# Patient Record
Sex: Male | Born: 1985 | Race: White | Hispanic: No | Marital: Single | Smoking: Current every day smoker
Health system: Southern US, Community
[De-identification: ages and names within clinical notes are randomized; demographics above are authoritative.]

## PROBLEM LIST (undated history)

## (undated) DIAGNOSIS — I10 Essential (primary) hypertension: Secondary | ICD-10-CM

---

## 2007-10-21 ENCOUNTER — Emergency Department: Payer: Self-pay | Admitting: Emergency Medicine

## 2008-10-04 ENCOUNTER — Emergency Department: Payer: Self-pay | Admitting: Emergency Medicine

## 2010-06-23 ENCOUNTER — Emergency Department: Payer: Self-pay | Admitting: Emergency Medicine

## 2011-04-30 ENCOUNTER — Emergency Department: Payer: Self-pay | Admitting: Emergency Medicine

## 2012-10-30 ENCOUNTER — Emergency Department: Payer: Self-pay | Admitting: Emergency Medicine

## 2013-01-08 ENCOUNTER — Emergency Department: Payer: Self-pay | Admitting: Emergency Medicine

## 2013-01-08 LAB — URINALYSIS, COMPLETE
Bacteria: NONE SEEN
Bilirubin,UR: NEGATIVE
Blood: NEGATIVE
Glucose,UR: NEGATIVE mg/dL (ref 0–75)
Nitrite: NEGATIVE
Ph: 7 (ref 4.5–8.0)
Protein: NEGATIVE
RBC,UR: NONE SEEN /HPF (ref 0–5)
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
## 2020-11-16 ENCOUNTER — Emergency Department
Admission: EM | Admit: 2020-11-16 | Discharge: 2020-11-18 | Disposition: A | Discharge status: Home / Self Care | Payer: Self-pay | Attending: Emergency Medicine | Admitting: Emergency Medicine

## 2020-11-16 ENCOUNTER — Other Ambulatory Visit: Payer: Self-pay

## 2020-11-16 DIAGNOSIS — Z046 Encounter for general psychiatric examination, requested by authority: Secondary | ICD-10-CM

## 2020-11-16 DIAGNOSIS — F319 Bipolar disorder, unspecified: Secondary | ICD-10-CM | POA: Insufficient documentation

## 2020-11-16 DIAGNOSIS — Z20822 Contact with and (suspected) exposure to covid-19: Secondary | ICD-10-CM | POA: Insufficient documentation

## 2020-11-16 DIAGNOSIS — I10 Essential (primary) hypertension: Secondary | ICD-10-CM | POA: Insufficient documentation

## 2020-11-16 DIAGNOSIS — F1510 Other stimulant abuse, uncomplicated: Secondary | ICD-10-CM

## 2020-11-16 DIAGNOSIS — F1994 Other psychoactive substance use, unspecified with psychoactive substance-induced mood disorder: Secondary | ICD-10-CM

## 2020-11-16 DIAGNOSIS — F17210 Nicotine dependence, cigarettes, uncomplicated: Secondary | ICD-10-CM | POA: Insufficient documentation

## 2020-11-16 DIAGNOSIS — R4589 Other symptoms and signs involving emotional state: Secondary | ICD-10-CM

## 2020-11-16 HISTORY — DX: Essential (primary) hypertension: I10

## 2020-11-16 LAB — RESP PANEL BY RT-PCR (FLU A&B, COVID) ARPGX2
Influenza A by PCR: NEGATIVE
Influenza B by PCR: NEGATIVE
SARS Coronavirus 2 by RT PCR: NEGATIVE

## 2020-11-16 LAB — CBC WITH DIFFERENTIAL/PLATELET
Abs Immature Granulocytes: 0.03 10*3/uL (ref 0.00–0.07)
Basophils Absolute: 0.1 10*3/uL (ref 0.0–0.1)
Basophils Relative: 1 %
Eosinophils Absolute: 0.3 10*3/uL (ref 0.0–0.5)
Eosinophils Relative: 3 %
HCT: 44.1 % (ref 39.0–52.0)
Hemoglobin: 14.7 g/dL (ref 13.0–17.0)
Immature Granulocytes: 0 %
Lymphocytes Relative: 26 %
Lymphs Abs: 2.5 10*3/uL (ref 0.7–4.0)
MCH: 30.2 pg (ref 26.0–34.0)
MCHC: 33.3 g/dL (ref 30.0–36.0)
MCV: 90.7 fL (ref 80.0–100.0)
Monocytes Absolute: 0.7 10*3/uL (ref 0.1–1.0)
Monocytes Relative: 7 %
Neutro Abs: 6.1 10*3/uL (ref 1.7–7.7)
Neutrophils Relative %: 63 %
Platelets: 259 10*3/uL (ref 150–400)
RBC: 4.86 MIL/uL (ref 4.22–5.81)
RDW: 14.1 % (ref 11.5–15.5)
WBC: 9.6 10*3/uL (ref 4.0–10.5)
nRBC: 0 % (ref 0.0–0.2)

## 2020-11-16 LAB — COMPREHENSIVE METABOLIC PANEL
ALT: 12 U/L (ref 0–44)
AST: 19 U/L (ref 15–41)
Albumin: 4.8 g/dL (ref 3.5–5.0)
Alkaline Phosphatase: 58 U/L (ref 38–126)
Anion gap: 7 (ref 5–15)
BUN: 15 mg/dL (ref 6–20)
CO2: 26 mmol/L (ref 22–32)
Calcium: 9.2 mg/dL (ref 8.9–10.3)
Chloride: 104 mmol/L (ref 98–111)
Creatinine, Ser: 0.9 mg/dL (ref 0.61–1.24)
GFR, Estimated: >60 mL/min (ref >60)
Glucose, Bld: 104 mg/dL — ABNORMAL HIGH (ref 70–99)
Potassium: 3.7 mmol/L (ref 3.5–5.1)
Sodium: 137 mmol/L (ref 135–145)
Total Bilirubin: 0.8 mg/dL (ref 0.3–1.2)
Total Protein: 7.8 g/dL (ref 6.5–8.1)

## 2020-11-16 LAB — URINE DRUG SCREEN, QUALITATIVE (ARMC ONLY)
Amphetamines, Ur Screen: POSITIVE — AB
Barbiturates, Ur Screen: NOT DETECTED
Benzodiazepine, Ur Scrn: NOT DETECTED
Cannabinoid 50 Ng, Ur ~~LOC~~: POSITIVE — AB
Cocaine Metabolite,Ur ~~LOC~~: NOT DETECTED
MDMA (Ecstasy)Ur Screen: NOT DETECTED
Methadone Scn, Ur: NOT DETECTED
Opiate, Ur Screen: NOT DETECTED
Phencyclidine (PCP) Ur S: NOT DETECTED
Tricyclic, Ur Screen: NOT DETECTED

## 2020-11-16 LAB — ETHANOL: Alcohol, Ethyl (B): <10 mg/dL (ref <10)

## 2020-11-16 LAB — ACETAMINOPHEN LEVEL: Acetaminophen (Tylenol), Serum: <10 ug/mL — ABNORMAL LOW (ref 10–30)

## 2020-11-16 LAB — SALICYLATE LEVEL: Salicylate Lvl: <7 mg/dL — ABNORMAL LOW (ref 7.0–30.0)

## 2020-11-16 NOTE — ED Notes (Signed)
Hourly rounding reveals patient in room. No complaints, stable, in no acute distress. Q15 minute rounds and monitoring via Security Cameras to continue. 

## 2020-11-16 NOTE — ED Notes (Signed)
Report to include Situation, Background, Assessment, and Recommendations received from Jeannette RN. Patient alert and oriented, warm and dry, in no acute distress. Patient denies SI, HI, AVH and pain. Patient made aware of Q15 minute rounds and security cameras for their safety. Patient instructed to come to me with needs or concerns.  

## 2020-11-16 NOTE — ED Notes (Signed)
Pt dressed out by this tech and Clinical biochemist  Black shoes Black coat Black socks [employer] Black pants Black belt  [employer] cards (no cash) [employer] in credit card case Marlboro cigarettes (1 pack) lighter
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
## 2020-11-16 NOTE — ED Notes (Signed)
Pt.s wife, Kipper Buch, called nurses station in hopes to "check pt out". This Clinical research associate explained to pt's wife that pt has not seen the tele-psych yet, but that we could give her a phone call to update her on anything.  Pt.s wife, Mathieu Schloemer, agreed. Phone number: [phone]
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
## 2020-11-16 NOTE — ED Notes (Addendum)
Pt given tv remote

## 2020-11-16 NOTE — BH Assessment (Signed)
Referral check information for Psychiatric Hospitalization faxed to;   [facility] (Angie-[phone]-or- [phone]), Declined due to having no insurance.   [facility] ([phone]--[phone]), unable to reach anyone.   [facility] [phone] & [phone]), unable to reach anyone.   [name] [phone]),   [name] [phone], [phone], [phone] or [phone]),    [facility] [phone]), unable to reach anyone.   [address]Franky Macho [phone] ex.[phone]),    [name] [phone]),

## 2020-11-16 NOTE — ED Notes (Signed)
Snack and beverage given. 
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
## 2020-11-16 NOTE — BH Assessment (Signed)
Referral information for Psychiatric Hospitalization faxed to;    [facility] [phone]- [phone]),    [place] ([phone]--[phone])   [facility] [phone] & [phone])   [name] [phone]),   [place] [phone], [phone], [phone] or [phone]),    [facility] [phone]),    [name] [phone] [phone]),    [facility] [phone])
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
## 2020-11-16 NOTE — ED Notes (Addendum)
Patient sitting up in bed reports he is tired of sleeping. He is ready to go home, he had a dispute with his wife and said something stupid, but he does not want to kill himself or hurt anyone. Patient shares he believes in jesus and will not do anything to hurt himself. Awaiting psych consult and disposition.

## 2020-11-16 NOTE — ED Notes (Addendum)
Patient requested phone to call his wife, phone was provided. Patient returned phone an soon after phone was retrieved wife called nurse and asked what can she do to get him out of here. It was explained that patient has not had a psych consult yet, and when he has been evaluated the psychiatrist will determine if patient is a safe discharge to home. In the mean time not much can be done since patient was under IVC.
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
## 2020-11-17 NOTE — ED Notes (Signed)
Hourly rounding reveals patient in room. No complaints, stable, in no acute distress. Q15 minute rounds and monitoring via Security Cameras to continue. 

## 2020-11-17 NOTE — ED Notes (Signed)
Pt requested shower. Tech provided supplies and clean scrub set.  Pt in shower currently.

## 2020-11-17 NOTE — ED Notes (Signed)
Pt. Was given his lunch tray and a drink.  

## 2020-11-17 NOTE — BH Assessment (Signed)
Referral Check:   Louis Haley (Angie-[phone]-or- [phone]) Per previous Clinical research associate, Declined due to having no insurance.   [facility] ([phone]--[phone]) No answer, received automated message to fax referral but unable to leave a voicemail   [facility] (-[phone] & [phone]) [name] reports at capacity and try tomorrow    [name] [phone]) No answer    [name] [phone], [phone], [phone] or [phone]) No answer     [facility] [phone]) [name] reports no male beds available, try tomorrow   [name] [phone]) Left voicemail    [name] [phone]) Left voicemail

## 2020-11-17 NOTE — ED Notes (Signed)
VS not taken, patient asleep 

## 2020-11-17 NOTE — ED Notes (Signed)
Spoke with pt's mother who states that they are willing to come get him and do outpatient therapy. Informed mother that psychiatrist is not here today but that this RN will pass information along.

## 2020-11-17 NOTE — ED Notes (Signed)
Pt has wife at bedside for 15 min visit. Monitored by Jill Side.

## 2020-11-17 NOTE — ED Provider Notes (Signed)
Emergency Medicine Observation Re-evaluation Note  Louis Haley is a 35 y.o. male, seen on rounds today.  Pt initially presented to the ED for complaints of IVC Currently, the patient is calm and cooperative.  Has no acute complaints..  Physical Exam  BP (!) 141/98 (BP Location: Right Arm)   Pulse (!) 126   Temp 98.4 F (36.9 C) (Oral)   Resp 16   Ht 5\' 6"  (1.676 m)   Wt 72.6 kg   SpO2 100%   BMI 25.82 kg/m  Physical Exam General: Awake alert, no distress.  Calm and cooperative, pleasant Cardiac: Regular rate and rhythm around 90 bpm.  No murmur. Lungs: to auscultation bilaterally, regular respiratory rate Psych: Calm and cooperative.  Denies SI or HI  ED Course / MDM   Lab work performed yesterday reviewed by myself overall reassuring besides amphetamine and cannabinoid positive urine drug toxicology  Plan  Current plan is for likely inpatient placement. Patient is under full IVC at this time.   Huel Cote, MD 11/17/20 1324

## 2020-11-17 NOTE — ED Notes (Signed)
Pt received breakfast tray. PT very agitated about still being in here. Very upset that staff has told him multiple times that he would talk to someone and then when he did talk to someone he didn't even know they were psych doc. And had no idea that a plan had already been decided for him to be placed in patient care and waiting on bed at different facility. This made the pt very upset to hear from RN this morning.  Tech and RN tried to tell pt that we hear and see what he is saying. RN gave pr phone and pt called wife very upset about his position here as a pt.

## 2020-11-17 NOTE — ED Notes (Signed)
Pt very upset that he is still here. Pt states that he and his wife have worked things out and he can go back home to her. Pt denies SI/HI. Pt thinks that he hasn't seen psychiatry. Pt informed that he saw an NP the night before last and inpatient was decided. Pt upset about this. Wife and mother have called and attempted to sign pt out but informed that pt is under an IVC and cannot be signed out. Pt given phone to call wife.

## 2020-11-17 NOTE — ED Notes (Signed)
Report to include situation, background, assessment and recommendations from Sarah RN. Patient sleeping, respirations regular and unlabored. Q15 minute rounds and security camera observation to continue.    

## 2020-11-17 NOTE — ED Notes (Signed)
Snack and beverage given. 

## 2020-11-17 NOTE — ED Notes (Signed)
Pt given meal tray.

## 2020-11-17 NOTE — ED Notes (Signed)
beverage given.  

## 2020-11-18 DIAGNOSIS — F1994 Other psychoactive substance use, unspecified with psychoactive substance-induced mood disorder: Secondary | ICD-10-CM

## 2020-11-18 DIAGNOSIS — F1510 Other stimulant abuse, uncomplicated: Secondary | ICD-10-CM

## 2020-11-18 NOTE — Consult Note (Signed)
[facility] Face-to-Face Psychiatry Consult   Reason for Consult: Follow-up consult 35 year old man came to the hospital after an argument with his wife in which she made statements about suicide Referring Physician: Katrinka Blazing Patient Identification: Louis Haley MRN:  [id number] Principal Diagnosis: Substance induced mood disorder (HCC) Diagnosis:  Principal Problem:   Substance induced mood disorder (HCC) Active Problems:   Bipolar 1 disorder (HCC)   Amphetamine abuse (HCC)   Total Time spent with patient: 30 minutes  Subjective:   Louis Haley is a 35 y.o. male patient admitted with "I said stupid stuff".  HPI: Follow-up 35 year old man with a history of mood and behavior problems and substance abuse.  Got into an argument with his wife made statements implying suicidal ideation.  Also allegedly got agitated and made some threatening statements.  Patient says his primary irritation was about money.  He does not work outside the home and worries a lot about finances.  Patient currently denies suicidal or homicidal thought.  Denies psychosis.  Initially denied any substance abuse but when confronted with drug screen admits to methamphetamine and marijuana use.  Denies alcohol abuse.  Denies any hallucinations.  Currently states that he is feeling okay and does not have any thoughts of hurting himself or anyone else.  Past Psychiatric History: No history of any mental health treatment or medication or hospitalizations.  Denies history of suicide attempts  Risk to Self:   Risk to Others:   Prior Inpatient Therapy:   Prior Outpatient Therapy:    Past Medical History:  Past Medical History:  Diagnosis Date  . Hypertension    History reviewed. No pertinent surgical history. Family History: History reviewed. No pertinent family history. Family Psychiatric  History: Does not know of any Social History:  Social History   Substance and Sexual Activity  Alcohol Use Never     Social History    Substance and Sexual Activity  Drug Use Yes  . Types: Marijuana    Social History   Socioeconomic History  . Marital status: Single    Spouse name: Not on file  . Number of children: Not on file  . Years of education: Not on file  . Highest education level: Not on file  Occupational History  . Not on file  Tobacco Use  . Smoking status: Current Every Day Smoker    Packs/day: 1.50    Years: 13.00    Pack years: 19.50  . Smokeless tobacco: Never Used  Substance and Sexual Activity  . Alcohol use: Never  . Drug use: Yes    Types: Marijuana  . Sexual activity: Not on file  Other Topics Concern  . Not on file  Social History Narrative  . Not on file   Social Determinants of Health   Financial Resource Strain: Not on file  Food Insecurity: Not on file  Transportation Needs: Not on file  Physical Activity: Not on file  Stress: Not on file  Social Connections: Not on file   Additional Social History:    Allergies:   Allergies  Allergen Reactions  . Chocolate Flavor Anaphylaxis and Hives  . Penicillins Hives    Labs: No results found for this or any previous visit (from the past 48 hour(s)).  No current facility-administered medications for this encounter.   No current outpatient medications on file.    Musculoskeletal: Strength & Muscle Tone: within normal limits Gait & Station: normal Patient leans: N/A  Psychiatric Specialty Exam: Physical Exam Vitals and nursing note  reviewed.  Constitutional:      Appearance: He is well-developed and well-nourished.  HENT:     Head: Normocephalic and atraumatic.  Eyes:     Conjunctiva/sclera: Conjunctivae normal.     Pupils: Pupils are equal, round, and reactive to light.  Cardiovascular:     Heart sounds: Normal heart sounds.  Pulmonary:     Effort: Pulmonary effort is normal.  Abdominal:     Palpations: Abdomen is soft.  Musculoskeletal:        General: Normal range of motion.     Cervical back: Normal  range of motion.  Skin:    General: Skin is warm and dry.  Neurological:     General: No focal deficit present.     Mental Status: He is alert.  Psychiatric:        Attention and Perception: Attention normal.        Mood and Affect: Mood is anxious.        Speech: Speech normal.        Behavior: Behavior is cooperative.        Thought Content: Thought content normal.        Cognition and Memory: Cognition normal. Memory is impaired.        Judgment: Judgment normal.     Review of Systems  Constitutional: Negative.   HENT: Negative.   Eyes: Negative.   Respiratory: Negative.   Cardiovascular: Negative.   Gastrointestinal: Negative.   Musculoskeletal: Negative.   Skin: Negative.   Neurological: Negative.   Psychiatric/Behavioral: Negative for suicidal ideas.    Blood pressure 121/83, pulse 84, temperature 98.1 F (36.7 C), temperature source Oral, resp. rate 18, height 5\' 6"  (1.676 m), weight 72.6 kg, SpO2 100 %.Body mass index is 25.82 kg/m.  General Appearance: Casual  Eye Contact:  Good  Speech:  Clear and Coherent  Volume:  Normal  Mood:  Euthymic  Affect:  Constricted  Thought Process:  Goal Directed  Orientation:  Full (Time, Place, and Person)  Thought Content:  Logical  Suicidal Thoughts:  No  Homicidal Thoughts:  No  Memory:  Immediate;   Fair Recent;   Fair Remote;   Fair  Judgement:  Impaired  Insight:  Shallow  Psychomotor Activity:  Normal  Concentration:  Concentration: Fair  Recall:  of Knowledge:  Fair  Language:  Fair  Akathisia:  No  Handed:  Right  AIMS (if indicated):     Assets:  Desire for Improvement Housing  ADL's:  Impaired  Cognition:  Impaired,  Mild  Sleep:        Treatment Plan Summary: Plan Currently does not meet commitment criteria.  Counseling about substance abuse and encourage patient to get involved in outpatient mental health treatment.  Referrals will be provided.  No prescriptions needed.  Case reviewed  with emergency room physician and TTS.  Patient can be discharged from the emergency room.  Disposition: Patient does not meet criteria for psychiatric inpatient admission. Supportive therapy provided about ongoing stressors.  [employer], MD 11/18/2020 5:11 PM

## 2020-11-18 NOTE — ED Provider Notes (Signed)
Emergency Medicine Observation Re-evaluation Note  Louis Haley is a 35 y.o. male, seen on rounds today.  Pt initially presented to the ED for complaints of IVC Currently, the patient is sleeping.  Physical Exam  BP (!) 131/96   Pulse (!) 108   Temp 98.1 F (36.7 C)   Resp 16   Ht 5\' 6"  (1.676 m)   Wt 72.6 kg   SpO2 98%   BMI 25.82 kg/m  Physical Exam Constitutional:      Appearance: He is not ill-appearing or toxic-appearing.  HENT:     Head: Atraumatic.  Cardiovascular:     Comments: Well perfused Pulmonary:     Effort: Pulmonary effort is normal.  Abdominal:     General: There is no distension.  Musculoskeletal:        General: No deformity.  Skin:    Findings: No rash.  Neurological:     General: No focal deficit present.     Cranial Nerves: No cranial nerve deficit.      ED Course / MDM  EKG:    I have reviewed the labs performed to date as well as medications administered while in observation.  Recent changes in the last 24 hours include none.  Plan  Current plan is for inpatient psychiatric care. Patient is under full IVC at this time.   , MD 11/18/20 [phone]

## 2020-11-18 NOTE — ED Notes (Signed)
IVC PAPERS  RESCINDED PER  DR  CLAPACS MD  INFORMED  AMY  B  RN 

## 2020-11-18 NOTE — ED Notes (Signed)
Hourly rounding reveals patient in room. No complaints, stable, in no acute distress. Q15 minute rounds and monitoring via Security Cameras to continue. 

## 2020-11-18 NOTE — ED Notes (Signed)
Pt discharged home. VS stable. All belongings returned to patient. Discharge instructions reviewed with patient.  

## 2020-11-18 NOTE — ED Notes (Signed)
VS not taken, patient asleep 

## 2020-11-18 NOTE — ED Notes (Signed)
Pt speaking to his wife on the phone.

## 2020-11-18 NOTE — ED Notes (Signed)
Pt again expresses his desire to go home. He again explains he has a plan in the works for when he leaves and wife and mother are both on board with helping hold him to this plan. He explains that he just cant believe being angry landed him in this situation. He is calm and states he got good rest last night that made him feel better. He states that he is appreciative of everything staff has helped him with since being here.  However he is ready to go and very much misses his kids.   Pt received breakfast tray
